# Patient Record
Sex: Male | Born: 1945 | Race: White | Hispanic: No
Health system: Southern US, Community
[De-identification: ages and names within clinical notes are randomized; demographics above are authoritative.]

---

## 2018-06-17 ENCOUNTER — Emergency Department
Admission: EM | Admit: 2018-06-17 | Discharge: 2018-06-18 | Disposition: A | Discharge status: Home / Self Care | Payer: Medicare Other | Attending: Emergency Medicine | Admitting: Emergency Medicine

## 2018-06-17 ENCOUNTER — Emergency Department: Payer: Medicare Other

## 2018-06-17 DIAGNOSIS — R05 Cough: Secondary | ICD-10-CM | POA: Diagnosis not present

## 2018-06-17 DIAGNOSIS — R0602 Shortness of breath: Secondary | ICD-10-CM | POA: Insufficient documentation

## 2018-06-17 LAB — CBC WITH DIFFERENTIAL/PLATELET
Abs Immature Granulocytes: 0.02 10*3/uL (ref 0.00–0.07)
BASOS ABS: 0 10*3/uL (ref 0.0–0.1)
Basophils Relative: 1 %
EOS ABS: 1 10*3/uL — AB (ref 0.0–0.5)
Eosinophils Relative: 12 %
HCT: 43.3 % (ref 39.0–52.0)
Hemoglobin: 14.4 g/dL (ref 13.0–17.0)
Immature Granulocytes: 0 %
LYMPHS ABS: 1.1 10*3/uL (ref 0.7–4.0)
LYMPHS PCT: 13 %
MCH: 31.4 pg (ref 26.0–34.0)
MCHC: 33.3 g/dL (ref 30.0–36.0)
MCV: 94.3 fL (ref 80.0–100.0)
Monocytes Absolute: 0.5 10*3/uL (ref 0.1–1.0)
Monocytes Relative: 6 %
NEUTROS PCT: 68 %
NRBC: 0 % (ref 0.0–0.2)
Neutro Abs: 5.8 10*3/uL (ref 1.7–7.7)
Platelets: 152 10*3/uL (ref 150–400)
RBC: 4.59 MIL/uL (ref 4.22–5.81)
RDW: 14.8 % (ref 11.5–15.5)
WBC: 8.4 10*3/uL (ref 4.0–10.5)

## 2018-06-17 LAB — BASIC METABOLIC PANEL
ANION GAP: 9 (ref 5–15)
BUN: 22 mg/dL (ref 8–23)
CHLORIDE: 103 mmol/L (ref 98–111)
CO2: 26 mmol/L (ref 22–32)
Calcium: 8.9 mg/dL (ref 8.9–10.3)
Creatinine, Ser: 1.03 mg/dL (ref 0.61–1.24)
GFR calc non Af Amer: >60 mL/min (ref >60)
Glucose, Bld: 120 mg/dL — ABNORMAL HIGH (ref 70–99)
POTASSIUM: 4.4 mmol/L (ref 3.5–5.1)
Sodium: 138 mmol/L (ref 135–145)

## 2018-06-17 LAB — TROPONIN I: Troponin I: <0.03 ng/mL (ref <0.03)

## 2018-06-17 MED ORDER — IPRATROPIUM-ALBUTEROL 0.5-2.5 (3) MG/3ML IN SOLN
3.0000 mL | Freq: Once | RESPIRATORY_TRACT | Status: AC
  Administered 2018-06-17: 3 mL via RESPIRATORY_TRACT
  Filled 2018-06-17: qty 3

## 2018-06-17 MED ORDER — ALBUTEROL SULFATE HFA 108 (90 BASE) MCG/ACT IN AERS: 2.0000 | INHALATION_SPRAY | Freq: Four times a day (QID) | RESPIRATORY_TRACT | 0 refills | Status: AC | PRN

## 2018-06-17 NOTE — ED Provider Notes (Signed)
[facility] Emergency Department Provider Note  ____________________________________________   I have reviewed the triage vital signs and the nursing notes.   HISTORY  Chief Complaint Shortness of Breath   History limited by: Not Limited   HPI Logan Hall is a 72 y.o. male who presents to the emergency department today because of concern for shortness of breath. Patient states that it started today. He noticed a little bit of shortness of breath earlier in the day but it got worse this evening. It was made worse after he took a shower. He denies any chest pain with this. Some cough. He does have a history of emphysema but states he does not have any inhalers. Denies ever requiring steroids in the past. No recent known sick contacts.    No past medical history on file.  There are no active problems to display for this patient.   Prior to Admission medications   Not on File    Allergies Patient has no allergy information on record.  No family history on file.  Social History Social History   Tobacco Use  . Smoking status: Not on file  Substance Use Topics  . Alcohol use: Not on file  . Drug use: Not on file    Review of Systems Constitutional: No fever/chills Eyes: No visual changes. ENT: No sore throat. Cardiovascular: Denies chest pain. Respiratory: Positive for shortness of breath. Gastrointestinal: No abdominal pain.  No nausea, no vomiting.  No diarrhea.   Genitourinary: Negative for dysuria. Musculoskeletal: Negative for back pain. Skin: Negative for rash. Neurological: Negative for headaches, focal weakness or numbness.  ____________________________________________   PHYSICAL EXAM:  VITAL SIGNS: ED Triage Vitals  Enc Vitals Group     BP 06/17/18 2122 (!) 164/115     Pulse Rate 06/17/18 2122 (!) 115     Resp --      Temp 06/17/18 2122 98.7 F (37.1 C)     Temp Source 06/17/18 2122 Oral     SpO2 06/17/18 2122 96 %   Weight 06/17/18 2124 127 lb (57.6 kg)     Height 06/17/18 2124 6\' 2"  (1.88 m)     Head Circumference --      Peak Flow --      Pain Score 06/17/18 2123 0   Constitutional: Alert and oriented.  Eyes: Conjunctivae are normal.  ENT      Head: Normocephalic and atraumatic.      Nose: No congestion/rhinnorhea.      Mouth/Throat: Mucous membranes are moist.      Neck: No stridor. Hematological/Lymphatic/Immunilogical: No cervical lymphadenopathy. Cardiovascular: Tachycardic, regular rhythm.  No murmurs, rubs, or gallops.  Respiratory: Normal respiratory effort without tachypnea nor retractions. Breath sounds are clear and equal bilaterally. No wheezes/rales/rhonchi. Gastrointestinal: Soft and non tender. No rebound. No guarding.  Genitourinary: Deferred Musculoskeletal: Normal range of motion in all extremities. No lower extremity edema. Neurologic:  Normal speech and language. No gross focal neurologic deficits are appreciated.  Skin:  Skin is warm, dry and intact. No rash noted. Psychiatric: Mood and affect are normal. Speech and behavior are normal. Patient exhibits appropriate insight and judgment.  ____________________________________________    LABS (pertinent positives/negatives)  Trop <0.03 CBC wbc 8.4, hgb 14.4, plt 152 BMP wnl except glu 120 ____________________________________________   EKG  I, Phineas Semen, attending physician, personally viewed and interpreted this EKG  EKG Time: 2125 Rate: 112 Rhythm: sinus tachycardia Axis: left axis deviation Intervals: qtc 480 QRS: LAFB, RBBB ST changes: no  st elevation Impression: abnormal ekg   ____________________________________________    RADIOLOGY  CXR Pulmonary nodule, no pneumonia, no pneumothorax  ____________________________________________   PROCEDURES  Procedures  ____________________________________________   INITIAL IMPRESSION / ASSESSMENT AND PLAN / ED COURSE  Pertinent labs & imaging  results that were available during my care of the patient were reviewed by me and considered in my medical decision making (see chart for details).   Patient presented to the emergency department today because of concerns for shortness of breath that started today.  Differential would be broad and would include ACS, PE, pneumonia, pneumothorax, COPD, CHF amongst other etiologies.  The patient blood work without findings concern for anemia and no elevation of troponin.  Chest x-ray showed a pulmonary nodule which I discussed with patient and recommended he discuss with his primary care doctor for CT scan.  Why did consider PE patient without any leg swelling, recent extended travel or chest pain.  Was initially tachycardic however this did improve during his course of stay.  Patient did feel better after DuoNeb.  I do wonder if COPD is the cause.  Will plan on discharging with albuterol inhaler.  Discussed return precautions.  ____________________________________________   FINAL CLINICAL IMPRESSION(S) / ED DIAGNOSES  Final diagnoses:  Shortness of breath     Note: This dictation was prepared with Dragon dictation. Any transcriptional errors that result from this process are unintentional     Phineas SemenGoodman, Brondon Wann, MD 06/18/18 0005

## 2018-06-17 NOTE — ED Triage Notes (Signed)
Pt here from home following some SOB WITH HX OF SAME  As well he reports weakness .

## 2018-06-17 NOTE — Discharge Instructions (Addendum)
Please follow up with your physician. Please also discuss getting a CT of your chest to evaluate the nodule (small area of tissue) that we saw on the chest x-ray today. Please seek medical attention for any high fevers, chest pain, shortness of breath, change in behavior, persistent vomiting, bloody stool or any other new or concerning symptoms.

## 2018-06-17 NOTE — ED Notes (Signed)
Pt able to ambulate up and down hallway with steady gate , sat maintained above 93%

## 2018-06-17 NOTE — ED Notes (Signed)
bgcx

## 2019-12-21 DEATH — deceased

## 2020-01-29 IMAGING — CR DG CHEST 2V
3 series · 3 of 3 positions shown · non-contrast
Comparison: None.

CLINICAL DATA: Shortness of breath.

EXAM:
CHEST - 2 VIEW

[chest lat (1 of 2)]
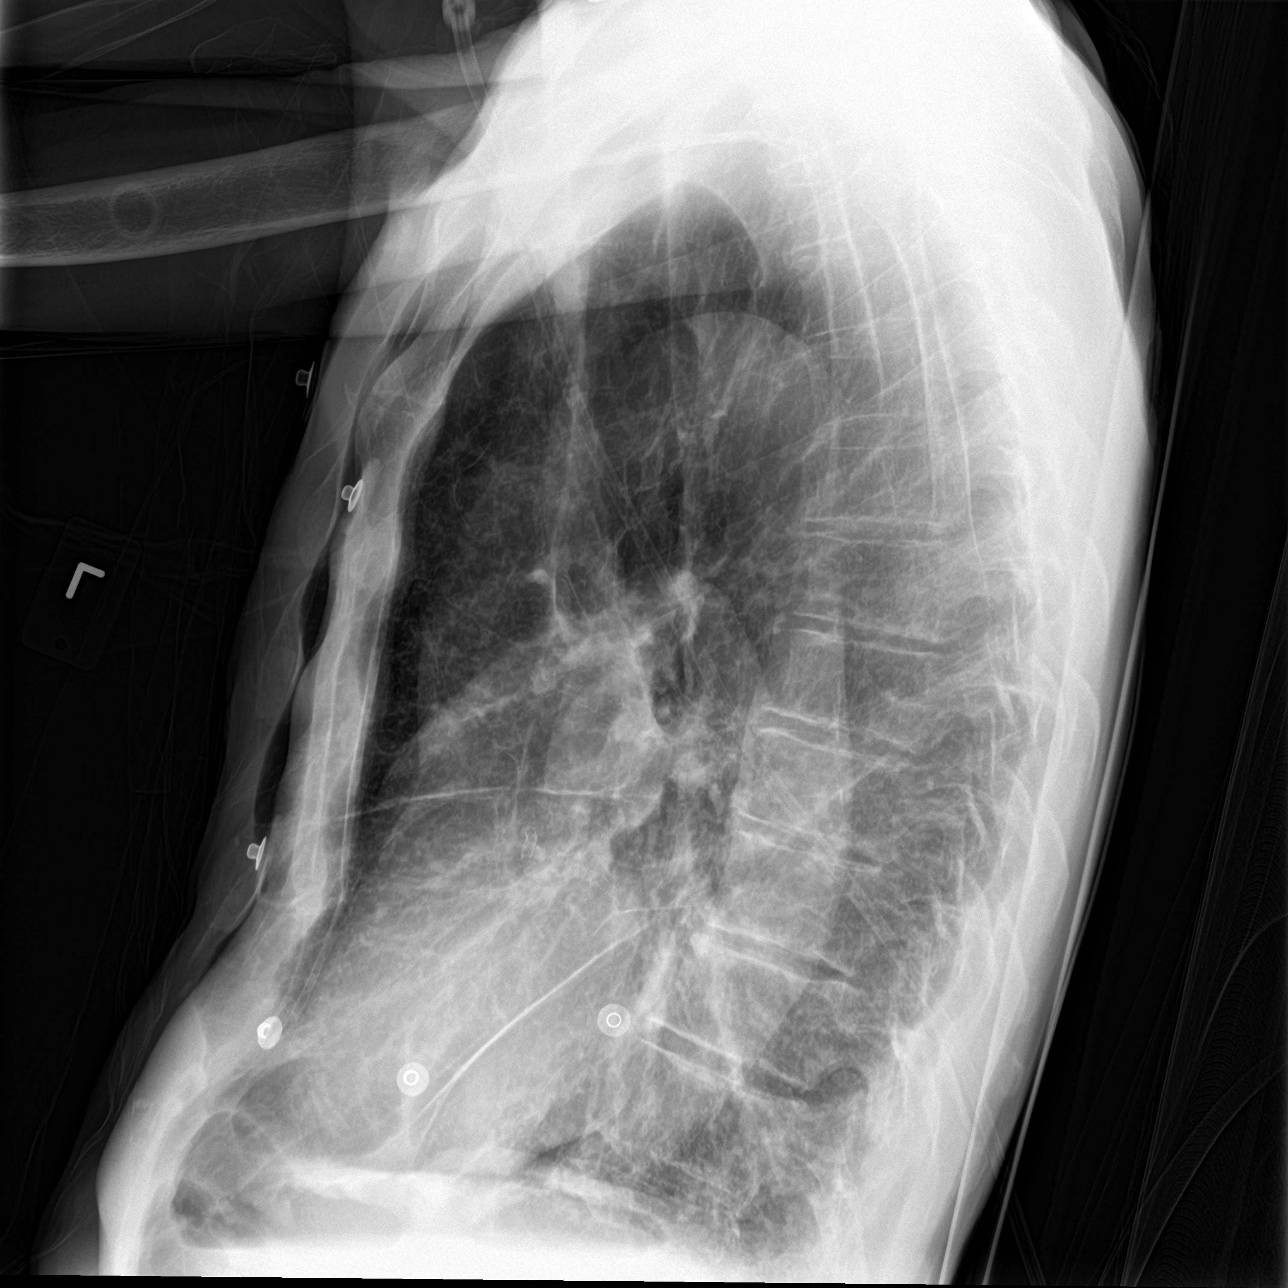

[chest ap]
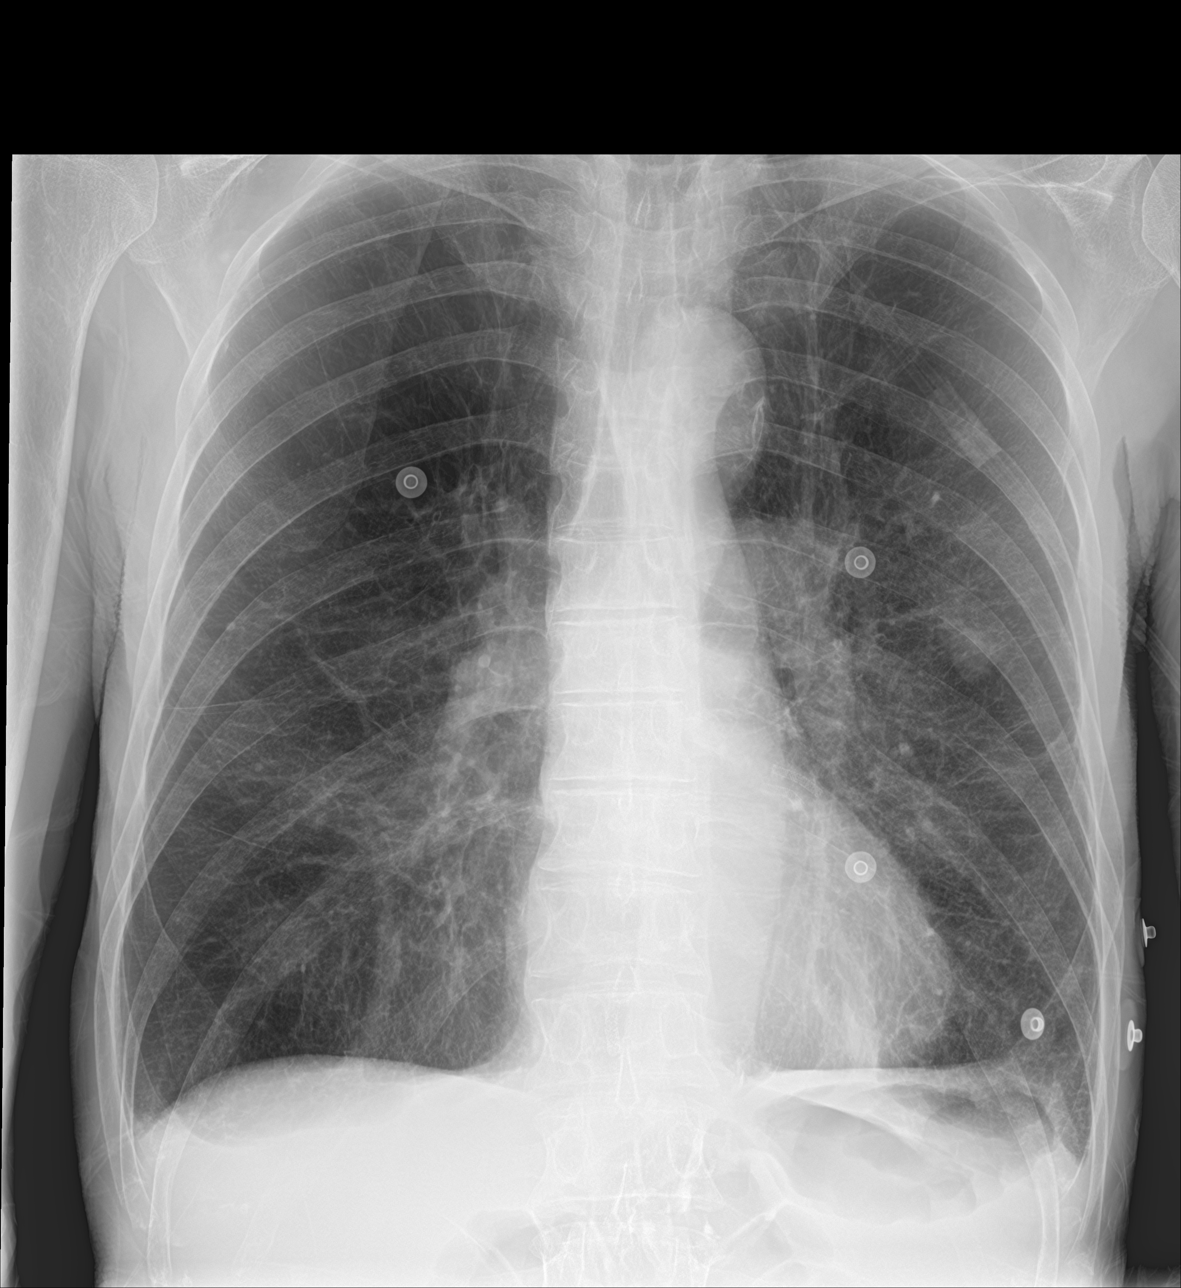

[chest lat (2 of 2)]
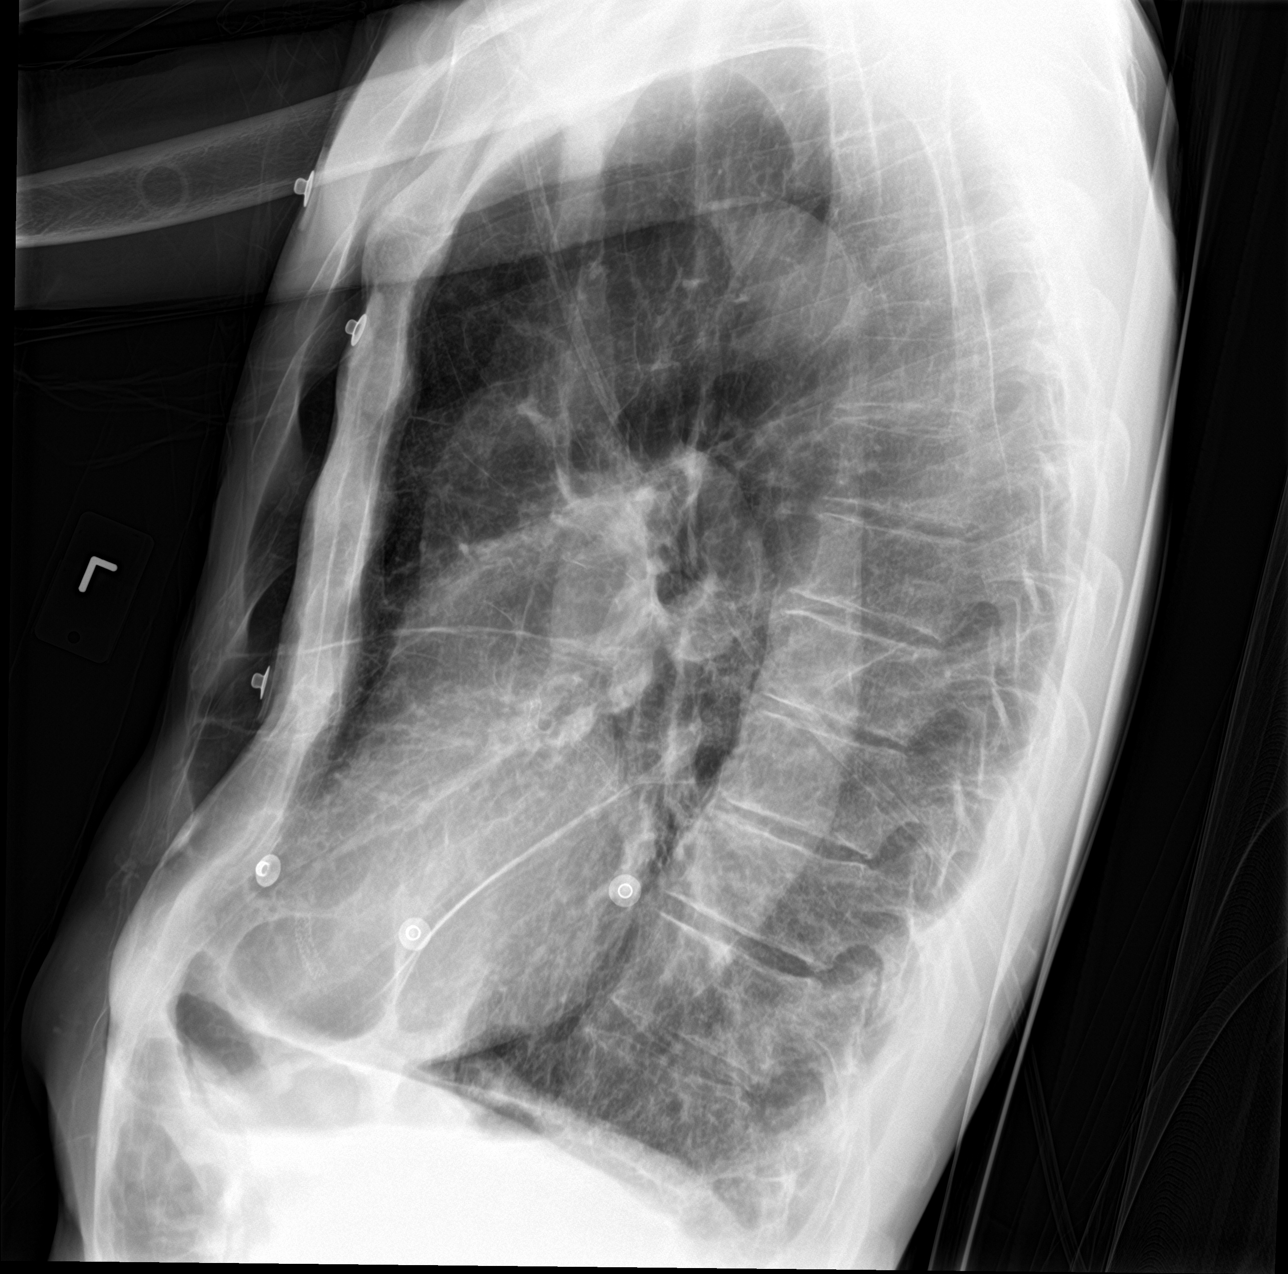

[3 of 3 positions shown; findings below may reference images not displayed]

FINDINGS: Cardiomediastinal silhouette is normal. Calcified aortic arch.
Increased lung volumes with flattened hemidiaphragms and chronic
appearing interstitial changes, apical suspected bulla with apical
pleural thickening. Bandlike density LEFT lung base. Faint nodular
density projecting LEFT mid lung zone, well characterized on lateral
radiograph. No pleural effusion or focal consolidation. Pectus
excavatum. No pneumothorax. Osteopenia.
IMPRESSION: 1. LEFT lung base atelectasis/scar.
2. Nodular density projecting LEFT mid lung zone could reflect
asymmetric nipple shadow though pulmonary nodule possible. Recommend
contrast enhanced CT chest on non emergent basis.
3.  Emphysema ([id number]).  Aortic Atherosclerosis ([id number]).
